# Patient Record
Sex: Female | Born: 2000 | Race: White | Hispanic: No | Marital: Single | Smoking: Never smoker
Health system: Southern US, Community
[De-identification: ages and names within clinical notes are randomized; demographics above are authoritative.]

## PROBLEM LIST (undated history)

## (undated) ENCOUNTER — Inpatient Hospital Stay: Payer: Self-pay

## (undated) DIAGNOSIS — D696 Thrombocytopenia, unspecified: Secondary | ICD-10-CM

## (undated) DIAGNOSIS — Z202 Contact with and (suspected) exposure to infections with a predominantly sexual mode of transmission: Secondary | ICD-10-CM

## (undated) DIAGNOSIS — F32A Depression, unspecified: Secondary | ICD-10-CM

## (undated) DIAGNOSIS — F329 Major depressive disorder, single episode, unspecified: Secondary | ICD-10-CM

## (undated) DIAGNOSIS — F419 Anxiety disorder, unspecified: Secondary | ICD-10-CM

## (undated) DIAGNOSIS — O99119 Other diseases of the blood and blood-forming organs and certain disorders involving the immune mechanism complicating pregnancy, unspecified trimester: Secondary | ICD-10-CM

## (undated) DIAGNOSIS — J45909 Unspecified asthma, uncomplicated: Secondary | ICD-10-CM

## (undated) HISTORY — PX: APPENDECTOMY: SHX54

## (undated) HISTORY — PX: TONSILLECTOMY AND ADENOIDECTOMY: SHX28

---
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
## 2018-09-11 ENCOUNTER — Encounter: Payer: Self-pay | Admitting: Nurse Practitioner

## 2019-08-16 ENCOUNTER — Encounter: Payer: Medicaid Other | Admitting: Obstetrics and Gynecology

## 2019-08-16 NOTE — Progress Notes (Deleted)
Patient, No Pcp Per   No chief complaint on file.   HPI:      Ms. Kimberly Casey is a 19 y.o. G1P0 whose LMP was No LMP recorded., presents today for NP  Diagnosed with chlamydia 08/13/19  Past Medical History:  Diagnosis Date  . Anxiety and depression   . Asthma    pt. reports  childhood asthma is no longer and issue  . STD exposure   . Thrombocytopenia affecting pregnancy [facility])     Past Surgical History:  Procedure Laterality Date  . APPENDECTOMY    . TONSILLECTOMY AND ADENOIDECTOMY      Family History  Family history unknown: Yes    Social History   Socioeconomic History  . Marital status: Single    Spouse name: Not on file  . Number of children: Not on file  . Years of education: Not on file  . Highest education level: Not on file  Occupational History  . Not on file  Tobacco Use  . Smoking status: Never Smoker  . Smokeless tobacco: Never Used  Substance and Sexual Activity  . Alcohol use: No  . Drug use: Yes    Types: Marijuana    Comment: pt. denied drug use, but pt. positive for THC per transfer reports  . Sexual activity: Yes    Comment: pt. is 4 months post partum  Other Topics Concern  . Not on file  Social History Narrative  . Not on file   Social Determinants of Health   Financial Resource Strain:   . Difficulty of Paying Living Expenses:   Food Insecurity:   . Worried About Charity fundraiser in the Last Year:   . Arboriculturist in the Last Year:   Transportation Needs:   . Film/video editor (Medical):   Marland Kitchen Lack of Transportation (Non-Medical):   Physical Activity:   . Days of Exercise per Week:   . Minutes of Exercise per Session:   Stress:   . Feeling of Stress :   Social Connections:   . Frequency of Communication with Friends and Family:   . Frequency of Social Gatherings with Friends and Family:   . Attends Religious Services:   . Active Member of Clubs or Organizations:   . Attends Archivist Meetings:     Marland Kitchen Marital Status:   Intimate Partner Violence:   . Fear of Current or Ex-Partner:   . Emotionally Abused:   Marland Kitchen Physically Abused:   . Sexually Abused:     Outpatient Medications Prior to Visit  Medication Sig Dispense Refill  . divalproex (DEPAKOTE ER) 250 MG 24 hr tablet Take 3 tablets (750 mg total) by mouth at bedtime. 90 tablet 0  . ferrous sulfate 325 (65 FE) MG tablet Take 325 mg by mouth daily with breakfast.    . hydrOXYzine (ATARAX/VISTARIL) 25 MG tablet Take 1 tablet (25 mg total) by mouth 3 (three) times daily as needed for anxiety. 30 tablet 0   No facility-administered medications prior to visit.      ROS:  Review of Systems BREAST: No symptoms   OBJECTIVE:   Vitals:  There were no vitals taken for this visit.  Physical Exam  Results: No results found for this or any previous visit (from the past 24 hour(s)).   Assessment/Plan: No diagnosis found.    No orders of the defined types were placed in this encounter.     No follow-ups on file.  Elmo Putt  B. Derak Schurman, PA-C 08/16/2019 11:55 AM

## 2023-11-03 ENCOUNTER — Encounter: Payer: Self-pay | Admitting: Physician Assistant

## 2023-11-03 ENCOUNTER — Ambulatory Visit: Admitting: Physician Assistant

## 2023-11-03 VITALS — BP 105/70 | HR 96 | Temp 98.1°F | Resp 16 | Ht 69.0 in | Wt 118.0 lb

## 2023-11-03 DIAGNOSIS — Z833 Family history of diabetes mellitus: Secondary | ICD-10-CM | POA: Diagnosis not present

## 2023-11-03 DIAGNOSIS — L659 Nonscarring hair loss, unspecified: Secondary | ICD-10-CM

## 2023-11-03 DIAGNOSIS — Z8639 Personal history of other endocrine, nutritional and metabolic disease: Secondary | ICD-10-CM | POA: Diagnosis not present

## 2023-11-03 DIAGNOSIS — E559 Vitamin D deficiency, unspecified: Secondary | ICD-10-CM | POA: Diagnosis not present

## 2023-11-03 DIAGNOSIS — E782 Mixed hyperlipidemia: Secondary | ICD-10-CM

## 2023-11-03 DIAGNOSIS — R5383 Other fatigue: Secondary | ICD-10-CM

## 2023-11-03 DIAGNOSIS — Z124 Encounter for screening for malignant neoplasm of cervix: Secondary | ICD-10-CM

## 2023-11-03 DIAGNOSIS — E538 Deficiency of other specified B group vitamins: Secondary | ICD-10-CM

## 2023-11-03 NOTE — Progress Notes (Signed)
 [facility] [address] [place], [place] [address]  Internal MEDICINE  Office Visit Note  Patient Name: Kimberly Casey  [id number]  [id number]  Date of Service: 11/03/2023   Complaints/HPI Pt is here for establishment of PCP. Chief Complaint  Patient presents with   New Patient (Initial Visit)   Labs Only    Wants to check thyroid    Hair/Scalp Problem    Bald spot that is worsening   HPI Pt is here to establish care -has not been seen by healthcare provider since she had her daughter 3 years ago. She lives with daughter and daughter's father -not taking any meds and denies any chronic health concerns -interested in labs, specifically thyroid  due to hair loss. Would also like to check vitamins and has a hx of iron deficiency. Also reports Fhx of DM in father and will screen for this -bald spot on back of head Started small but then has been getting bigger. First noticed about 1.5 months ago. States this occurred prior to dying her hair and has not pulled any hair out. Does notice a lot of hair shedding as well. Finds hair everywhere. 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  -no exercising, but is active with her daughter -due for pap and would like referral to see GYN for this  Current Medication: Outpatient Encounter Medications as of 11/03/2023  Medication Sig   [DISCONTINUED] divalproex  (DEPAKOTE  ER) 250 MG 24 hr tablet Take 3 tablets (750 mg total) by mouth at bedtime.   [DISCONTINUED] ferrous sulfate  325 (65 FE) MG tablet Take 325 mg by mouth daily with breakfast.    [DISCONTINUED] hydrOXYzine  (ATARAX /VISTARIL ) 25 MG tablet Take 1 tablet (25 mg total) by mouth 3 (three) times daily as needed for anxiety.   No facility-administered encounter medications on file as of 11/03/2023.    Surgical History: Past Surgical History:  Procedure Laterality Date   APPENDECTOMY     TONSILLECTOMY AND ADENOIDECTOMY      Medical History: Past Medical History:  Diagnosis Date   Anxiety and depression    Asthma    pt. reports  childhood asthma is no longer and issue   STD exposure    Thrombocytopenia affecting pregnancy (HCC)     Family History: Family History  Problem Relation Age of Onset   Crohn's disease Mother    Hearing loss Father    Diabetes Father     Social History   Socioeconomic History   Marital status: Single    Spouse name: Not on file   Number of children: Not on file   Years of education: Not on file   Highest education level: Not on file  Occupational History   Not on file  Tobacco Use   Smoking status: Never   Smokeless tobacco: Never  Substance and Sexual Activity   Alcohol use: No   Drug use: Yes    Types: Marijuana    Comment: pt. denied drug use, but pt. positive for THC per transfer reports   Sexual activity: Yes    Comment: pt. is 4 months post partum  Other Topics Concern  Not on file  Social History Narrative   Not on file   Social Drivers of Health   Financial Resource Strain: Not on file  Food Insecurity: Not on file  Transportation Needs: Not on file  Physical Activity: Not on file  Stress: Not on file  Social Connections: Not on file  Intimate Partner Violence: Not on file     Review of Systems  Constitutional:  Negative for chills, fatigue and unexpected weight change.  HENT:  Negative for congestion, rhinorrhea, sneezing and sore throat.   Eyes:  Negative for redness.  Respiratory:  Negative for cough, chest tightness and shortness of breath.   Cardiovascular:  Negative for chest pain and  palpitations.  Gastrointestinal:  Negative for abdominal pain, constipation, diarrhea, nausea and vomiting.  Genitourinary:  Negative for dysuria and frequency.  Musculoskeletal:  Negative for arthralgias, back pain, joint swelling and neck pain.  Skin:  Negative for rash.       Bald spot  Neurological: Negative.  Negative for tremors and numbness.  Hematological:  Negative for adenopathy. Does not bruise/bleed easily.  Psychiatric/Behavioral:  Negative for behavioral problems (Depression), sleep disturbance and suicidal ideas. The patient is not nervous/anxious.     Vital Signs: BP 105/70   Pulse 96   Temp 98.1 F (36.7 C)   Resp 16   Ht 5' 9 (1.753 m)   Wt 118 lb (53.5 kg)   SpO2 100%   BMI 17.43 kg/m    Physical Exam Vitals and nursing note reviewed.  Constitutional:      General: She is not in acute distress.    Appearance: She is well-developed. She is not diaphoretic.  HENT:     Head: Normocephalic and atraumatic.     Comments: Bald spot on back left of scalp Eyes:     Extraocular Movements: Extraocular movements intact.  Neck:     Thyroid : No thyromegaly.     Vascular: No JVD.     Trachea: No tracheal deviation.  Cardiovascular:     Rate and Rhythm: Normal rate and regular rhythm.     Heart sounds: Normal heart sounds. No murmur heard.    No friction rub. No gallop.  Pulmonary:     Effort: Pulmonary effort is normal. No respiratory distress.     Breath sounds: No wheezing or rales.  Chest:     Chest wall: No tenderness.  Skin:    General: Skin is warm and dry.  Neurological:     Mental Status: She is alert and oriented to person, place, and time.  Psychiatric:        Behavior: Behavior normal.        Thought Content: Thought content normal.        Judgment: Judgment normal.       Assessment/Plan: 1. Alopecia (Primary) Will check labs and will also refer to dermatology given pattern of hair loss - TSH + free T4 - ANA w/Reflex if Positive -  Ambulatory referral to Dermatology  2. Hx of iron deficiency - Fe+TIBC+Fer  3. Vitamin D deficiency - VITAMIN D 25 Hydroxy (Vit-D Deficiency, Fractures)  4. FHx: diabetes mellitus - Hgb A1C w/o eAG  5. B12 deficiency - B12 and Folate Panel  6. Mixed hyperlipidemia - Lipid Panel With LDL/HDL Ratio  7. Encounter for Papanicolaou smear of cervix - Ambulatory referral to Obstetrics / Gynecology  8. Other fatigue - CBC w/Diff/Platelet - Comprehensive metabolic panel with GFR - TSH + free T4 - Lipid Panel With  LDL/HDL Ratio - ANA w/Reflex if Positive - B12 and Folate Panel - VITAMIN D 25 Hydroxy (Vit-D Deficiency, Fractures) - Fe+TIBC+Fer - Hgb A1C w/o eAG   General Counseling: Alika verbalizes understanding of the findings of todays visit and agrees with plan of treatment. I have discussed any further diagnostic evaluation that may be needed or ordered today. We also reviewed her medications today. she has been encouraged to call the office with any questions or concerns that should arise related to todays visit.    Counseling:    Orders Placed This Encounter  Procedures   CBC w/Diff/Platelet   Comprehensive metabolic panel with GFR   TSH + free T4   Lipid Panel With LDL/HDL Ratio   ANA w/Reflex if Positive   B12 and Folate Panel   VITAMIN D 25 Hydroxy (Vit-D Deficiency, Fractures)   Fe+TIBC+Fer   Hgb A1C w/o eAG   Ambulatory referral to Dermatology   Ambulatory referral to Obstetrics / Gynecology    No orders of the defined types were placed in this encounter.    This patient was seen by Tinnie Pro, PA-C in collaboration with Dr. Sigrid Bathe as a part of collaborative care agreement.   Time spent:35 Minutes

## 2023-11-04 ENCOUNTER — Telehealth: Payer: Self-pay | Admitting: Physician Assistant

## 2023-11-04 NOTE — Telephone Encounter (Signed)
 Dermatology referral sent via [facility] to [facility].  Lvm notifying patient. Gave telephone# [phone]

## 2023-11-09 LAB — COMPREHENSIVE METABOLIC PANEL WITH GFR
ALT: 10 IU/L (ref 0–32)
AST: 13 IU/L (ref 0–40)
Albumin: 4.6 g/dL (ref 4.0–5.0)
Alkaline Phosphatase: 44 IU/L (ref 44–121)
BUN/Creatinine Ratio: 12 (ref 9–23)
BUN: 9 mg/dL (ref 6–20)
Bilirubin Total: 0.7 mg/dL (ref 0.0–1.2)
CO2: 22 mmol/L (ref 20–29)
Calcium: 9.5 mg/dL (ref 8.7–10.2)
Chloride: 104 mmol/L (ref 96–106)
Creatinine, Ser: 0.76 mg/dL (ref 0.57–1.00)
Globulin, Total: 2.1 g/dL (ref 1.5–4.5)
Glucose: 79 mg/dL (ref 70–99)
Potassium: 4.2 mmol/L (ref 3.5–5.2)
Sodium: 141 mmol/L (ref 134–144)
Total Protein: 6.7 g/dL (ref 6.0–8.5)
eGFR: 111 mL/min/1.73 (ref >59)

## 2023-11-09 LAB — CBC WITH DIFFERENTIAL/PLATELET
Basophils Absolute: 0 x10E3/uL (ref 0.0–0.2)
Basos: 1 %
EOS (ABSOLUTE): 0.1 x10E3/uL (ref 0.0–0.4)
Eos: 2 %
Hematocrit: 38.9 % (ref 34.0–46.6)
Hemoglobin: 13.1 g/dL (ref 11.1–15.9)
Immature Grans (Abs): 0 x10E3/uL (ref 0.0–0.1)
Immature Granulocytes: 0 %
Lymphocytes Absolute: 1.5 x10E3/uL (ref 0.7–3.1)
Lymphs: 43 %
MCH: 30.5 pg (ref 26.6–33.0)
MCHC: 33.7 g/dL (ref 31.5–35.7)
MCV: 91 fL (ref 79–97)
Monocytes Absolute: 0.2 x10E3/uL (ref 0.1–0.9)
Monocytes: 7 %
Neutrophils Absolute: 1.6 x10E3/uL (ref 1.4–7.0)
Neutrophils: 47 %
Platelets: 142 x10E3/uL — ABNORMAL LOW (ref 150–450)
RBC: 4.29 x10E6/uL (ref 3.77–5.28)
RDW: 11.9 % (ref 11.7–15.4)
WBC: 3.5 x10E3/uL (ref 3.4–10.8)

## 2023-11-09 LAB — LIPID PANEL WITH LDL/HDL RATIO
Cholesterol, Total: 108 mg/dL (ref 100–199)
HDL: 54 mg/dL (ref >39)
LDL Chol Calc (NIH): 41 mg/dL (ref 0–99)
LDL/HDL Ratio: 0.8 ratio (ref 0.0–3.2)
Triglycerides: 60 mg/dL (ref 0–149)
VLDL Cholesterol Cal: 13 mg/dL (ref 5–40)

## 2023-11-09 LAB — B12 AND FOLATE PANEL
Folate: 13.5 ng/mL (ref >3.0)
Vitamin B-12: 597 pg/mL (ref 232–1245)

## 2023-11-09 LAB — HGB A1C W/O EAG: Hgb A1c MFr Bld: 5.1 % (ref 4.8–5.6)

## 2023-11-09 LAB — TSH+FREE T4
Free T4: 1.42 ng/dL (ref 0.82–1.77)
TSH: 1.08 u[IU]/mL (ref 0.450–4.500)

## 2023-11-09 LAB — VITAMIN D 25 HYDROXY (VIT D DEFICIENCY, FRACTURES): Vit D, 25-Hydroxy: 37.4 ng/mL (ref 30.0–100.0)

## 2023-11-09 LAB — ANA W/REFLEX IF POSITIVE: Anti Nuclear Antibody (ANA): NEGATIVE

## 2023-11-15 ENCOUNTER — Ambulatory Visit: Payer: Self-pay | Admitting: Physician Assistant

## 2023-11-16 LAB — SPECIMEN STATUS REPORT

## 2023-11-16 LAB — FERRITIN: Ferritin: 39 ng/mL (ref 15–150)

## 2023-11-16 LAB — IRON AND TIBC
Iron Saturation: 42 % (ref 15–55)
Iron: 145 ug/dL (ref 27–159)
Total Iron Binding Capacity: 344 ug/dL (ref 250–450)
UIBC: 199 ug/dL (ref 131–425)

## 2023-12-22 ENCOUNTER — Encounter: Payer: Self-pay | Admitting: Physician Assistant

## 2023-12-22 ENCOUNTER — Ambulatory Visit: Admitting: Physician Assistant

## 2023-12-22 VITALS — BP 104/70 | HR 101 | Temp 98.3°F | Resp 16 | Ht 69.0 in | Wt 119.0 lb

## 2023-12-22 DIAGNOSIS — D696 Thrombocytopenia, unspecified: Secondary | ICD-10-CM | POA: Diagnosis not present

## 2023-12-22 DIAGNOSIS — L659 Nonscarring hair loss, unspecified: Secondary | ICD-10-CM

## 2023-12-22 DIAGNOSIS — R300 Dysuria: Secondary | ICD-10-CM | POA: Diagnosis not present

## 2023-12-22 DIAGNOSIS — Z0001 Encounter for general adult medical examination with abnormal findings: Secondary | ICD-10-CM

## 2023-12-22 NOTE — Progress Notes (Signed)
 [facility] [address] [place], [place] [address]  Internal MEDICINE  Office Visit Note  Patient Name: Kimberly Casey  [id number]  [id number]  Date of Service: 12/22/2023  Chief Complaint  Patient presents with   Annual Exam    Review labs     HPI Pt is here for routine health maintenance examination -doing well, no new concerns today -labs reviewed and normal except for low platelet. Upon chart review has had this before and may be chronic though most previous labs during pregnancy. Will recheck. Does admit easy bruising for a long time -GYN appt tomorrow for pap -still has circular patch of hair loss. A little growth now though. Hasn't heard from dermatology and will follow up on this -sleeping well.  -Appetite good.  -taking multivitamin  Current Medication: No outpatient encounter medications on file as of 12/22/2023.   No facility-administered encounter medications on file as of 12/22/2023.    Surgical History: Past Surgical History:  Procedure Laterality Date   APPENDECTOMY     TONSILLECTOMY AND ADENOIDECTOMY      Medical History: Past Medical History:  Diagnosis Date   Anxiety and depression    Asthma    pt. reports  childhood asthma is no longer and issue   STD exposure    Thrombocytopenia affecting pregnancy     Family History: Family History  Problem Relation Age of Onset   Crohn's disease Mother    Hearing loss Father    Diabetes Father       Review of Systems  Constitutional:  Negative for chills, fatigue and unexpected weight change.  HENT:  Negative for congestion, rhinorrhea, sneezing and sore throat.   Eyes:  Negative for redness.  Respiratory:  Negative for cough, chest tightness and shortness of breath.   Cardiovascular:  Negative for chest pain and palpitations.  Gastrointestinal:  Negative for abdominal pain, constipation, diarrhea, nausea and vomiting.  Genitourinary:  Negative for dysuria and frequency.   Musculoskeletal:  Negative for arthralgias, back pain, joint swelling and neck pain.  Skin:  Negative for rash.       Bald spot  Neurological: Negative.  Negative for tremors and numbness.  Hematological:  Negative for adenopathy. Does not bruise/bleed easily.  Psychiatric/Behavioral:  Negative for behavioral problems (Depression), sleep disturbance and suicidal ideas. The patient is not nervous/anxious.      Vital Signs: BP 104/70   Pulse (!) 101   Temp 98.3 F (36.8 C)   Resp 16   Ht 5' 9 (1.753 m)   Wt 119 lb (54 kg)   SpO2 98%   BMI 17.57 kg/m    Physical Exam Vitals and nursing note reviewed.  Constitutional:      General: She is not in acute distress.    Appearance: She is well-developed. She is not diaphoretic.  HENT:     Head: Normocephalic and atraumatic.     Comments: Bald spot on back left of scalp, some small new growth on exam Eyes:     Extraocular Movements: Extraocular movements intact.  Neck:     Thyroid : No thyromegaly.     Vascular: No JVD.     Trachea: No tracheal deviation.  Cardiovascular:     Rate and Rhythm: Normal rate and regular rhythm.     Heart sounds: Normal heart sounds. No murmur heard.    No friction rub. No gallop.  Pulmonary:     Effort: Pulmonary effort is normal. No respiratory distress.     Breath sounds:  No wheezing or rales.  Chest:     Chest wall: No tenderness.  Skin:    General: Skin is warm and dry.  Neurological:     Mental Status: She is alert and oriented to person, place, and time.  Psychiatric:        Behavior: Behavior normal.        Thought Content: Thought content normal.        Judgment: Judgment normal.      LABS: Recent Results (from the past 2160 hours)  CBC w/Diff/Platelet     Status: Abnormal   Collection Time: 11/08/23 11:09 AM  Result Value Ref Range   WBC 3.5 3.4 - 10.8 x10E3/uL   RBC 4.29 3.77 - 5.28 x10E6/uL   Hemoglobin 13.1 11.1 - 15.9 g/dL   Hematocrit 61.0 65.9 - 46.6 %   MCV 91 79 -  97 fL   MCH 30.5 26.6 - 33.0 pg   MCHC 33.7 31.5 - 35.7 g/dL   RDW 88.0 88.2 - 84.5 %   Platelets 142 (L) 150 - 450 x10E3/uL   Neutrophils 47 Not Estab. %   Lymphs 43 Not Estab. %   Monocytes 7 Not Estab. %   Eos 2 Not Estab. %   Basos 1 Not Estab. %   Neutrophils Absolute 1.6 1.4 - 7.0 x10E3/uL   Lymphocytes Absolute 1.5 0.7 - 3.1 x10E3/uL   Monocytes Absolute 0.2 0.1 - 0.9 x10E3/uL   EOS (ABSOLUTE) 0.1 0.0 - 0.4 x10E3/uL   Basophils Absolute 0.0 0.0 - 0.2 x10E3/uL   Immature Granulocytes 0 Not Estab. %   Immature Grans (Abs) 0.0 0.0 - 0.1 x10E3/uL  Comprehensive metabolic panel with GFR     Status: None   Collection Time: 11/08/23 11:09 AM  Result Value Ref Range   Glucose 79 70 - 99 mg/dL   BUN 9 6 - 20 mg/dL   Creatinine, Ser 9.23 0.57 - 1.00 mg/dL   eGFR 888 >40 fO/fpw/8.26   BUN/Creatinine Ratio 12 9 - 23   Sodium 141 134 - 144 mmol/L   Potassium 4.2 3.5 - 5.2 mmol/L   Chloride 104 96 - 106 mmol/L   CO2 22 20 - 29 mmol/L   Calcium 9.5 8.7 - 10.2 mg/dL   Total Protein 6.7 6.0 - 8.5 g/dL   Albumin 4.6 4.0 - 5.0 g/dL   Globulin, Total 2.1 1.5 - 4.5 g/dL   Bilirubin Total 0.7 0.0 - 1.2 mg/dL   Alkaline Phosphatase 44 44 - 121 IU/L    Comment: **Effective November 14, 2023 Alkaline Phosphatase**   reference interval will be changing to:              Age                Female          Female           0 -  5 days         47 - 127       47 - 127           6 - 10 days         29 - 242       29 - 242          11 - 20 days        109 - 357      109 - 357          21 - 30  days         94 - 494       94 - 494           1 -  2 months      149 - 539      149 - 539           3 -  6 months      131 - 452      131 - 452           7 - 11 months      117 - 401      117 - 401   12 months -  6 years       158 - 369      158 - 369           7 - 12 years       150 - 409      150 - 409               13 years       156 - 435       78 - 227               14 years       114 - 375       64 -  161               15 years        88 - 279       56 - 134               16 years        74 - 207       51 - 121               17 years        63 - 161       47 - 113          18 - 20 years        51 - 125       42 - 106          21 - 50 years         47 - 123       41 - 116          51 - 80 years        49 - 135       51 - 125              >80 years        48 - 129       48 - 129    AST 13 0 - 40 IU/L   ALT 10 0 - 32 IU/L  TSH + free T4     Status: None   Collection Time: 11/08/23 11:09 AM  Result Value Ref Range   TSH 1.080 0.450 - 4.500 uIU/mL   Free T4 1.42 0.82 - 1.77 ng/dL  Lipid Panel With LDL/HDL Ratio     Status: None   Collection Time: 11/08/23 11:09 AM  Result Value Ref Range   Cholesterol, Total 108 100 - 199 mg/dL   Triglycerides 60 0 - 149 mg/dL   HDL 54 >60 mg/dL   VLDL Cholesterol Cal 13 5 - 40 mg/dL   LDL Chol Calc (NIH) 41 0 - 99 mg/dL  LDL/HDL Ratio 0.8 0.0 - 3.2 ratio    Comment:                                     LDL/HDL Ratio                                             Men  Women                               1/2 Avg.Risk  1.0    1.5                                   Avg.Risk  3.6    3.2                                2X Avg.Risk  6.2    5.0                                3X Avg.Risk  8.0    6.1   ANA w/Reflex if Positive     Status: None   Collection Time: 11/08/23 11:09 AM  Result Value Ref Range   Anti Nuclear Antibody (ANA) Negative Negative  B12 and Folate Panel     Status: None   Collection Time: 11/08/23 11:09 AM  Result Value Ref Range   Vitamin B-12 597 232 - 1,245 pg/mL   Folate 13.5 >3.0 ng/mL    Comment: A serum folate concentration of less than 3.1 ng/mL is considered to represent clinical deficiency.   VITAMIN D  25 Hydroxy (Vit-D Deficiency, Fractures)     Status: None   Collection Time: 11/08/23 11:09 AM  Result Value Ref Range   Vit D, 25-Hydroxy 37.4 30.0 - 100.0 ng/mL    Comment: Vitamin D  deficiency has been defined by the  Institute of Medicine and an Endocrine Society practice guideline as a level of serum 25-OH vitamin D  less than 20 ng/mL (1,2). The Endocrine Society went on to further define vitamin D  insufficiency as a level between 21 and 29 ng/mL (2). 1. IOM (Institute of Medicine). 2010. Dietary reference    intakes for calcium and D. [place]: The    [employer]. 2. Holick MF, Binkley Heron, Bischoff-Ferrari HA, et al.    Evaluation, treatment, and prevention of vitamin D     deficiency: an Endocrine Society clinical practice    guideline. JCEM. 2011 Jul; 96(7):1911-30.   Hgb A1C w/o eAG     Status: None   Collection Time: 11/08/23 11:09 AM  Result Value Ref Range   Hgb A1c MFr Bld 5.1 4.8 - 5.6 %    Comment:          Prediabetes: 5.7 - 6.4          Diabetes: >6.4          Glycemic control for adults with diabetes: <7.0   Iron and TIBC     Status: None   Collection Time: 11/08/23 11:09 AM  Result Value Ref Range   Total  Iron Binding Capacity 344 250 - 450 ug/dL   UIBC 800 868 - 574 ug/dL   Iron 854 27 - 840 ug/dL   Iron Saturation 42 15 - 55 %  Ferritin     Status: None   Collection Time: 11/08/23 11:09 AM  Result Value Ref Range   Ferritin 39 15 - 150 ng/mL  Specimen status report     Status: None   Collection Time: 11/08/23 11:09 AM  Result Value Ref Range   specimen status report Comment     Comment: Written Authorization Written Authorization Written Authorization Received. Authorization received from NIMISHA PATEL 11-16-2023 Logged by Darice Brake   UA/M w/rflx Culture, Routine     Status: Abnormal (Preliminary result)   Collection Time: 12/22/23  3:58 PM   Specimen: Urine   Urine  Result Value Ref Range   Specific Gravity, UA 1.020 1.005 - 1.030   pH, UA 7.0 5.0 - 7.5   Color, UA Yellow Yellow   Appearance Ur Clear Clear   Leukocytes,UA Trace (A) Negative   Protein,UA Negative Negative/Trace   Glucose, UA Negative Negative   Ketones, UA Negative  Negative   RBC, UA Negative Negative   Bilirubin, UA Negative Negative   Urobilinogen, Ur 1.0 0.2 - 1.0 mg/dL   Nitrite, UA Negative Negative   Microscopic Examination See below:     Comment: Microscopic was indicated and was performed.   Urinalysis Reflex WILL FOLLOW   Microscopic Examination     Status: None (Preliminary result)   Collection Time: 12/22/23  3:58 PM   Urine  Result Value Ref Range   WBC, UA WILL FOLLOW    RBC, Urine WILL FOLLOW    Epithelial Cells (non renal) WILL FOLLOW    Renal Epithel, UA WILL FOLLOW    Casts WILL FOLLOW    Cast Type WILL FOLLOW    Crystals WILL FOLLOW    Crystal Type WILL FOLLOW    Mucus, UA WILL FOLLOW    Bacteria, UA WILL FOLLOW    Yeast, UA WILL FOLLOW    Trichomonas, UA WILL FOLLOW    Urinalysis Comments WILL FOLLOW         Assessment/Plan: 1. Encounter for general adult medical examination with abnormal findings (Primary) CPE performed, labs reviewed, pap scheduled for tomorrow  2. Alopecia Labs normal, some new growth on exam today, but will still follow up on dermatology referral  3. Low platelet count Will monitor, may need hematology in future - CBC w/Diff/Platelet  4. Dysuria - UA/M w/rflx Culture, Routine   General Counseling: Kimberly Casey verbalizes understanding of the findings of todays visit and agrees with plan of treatment. I have discussed any further diagnostic evaluation that may be needed or ordered today. We also reviewed her medications today. she has been encouraged to call the office with any questions or concerns that should arise related to todays visit.    Counseling:    Orders Placed This Encounter  Procedures   Microscopic Examination   UA/M w/rflx Culture, Routine   CBC w/Diff/Platelet    No orders of the defined types were placed in this encounter.   This patient was seen by Tinnie Pro, PA-C in collaboration with Dr. Sigrid Bathe as a part of collaborative care agreement.  Total time  spent:30 Minutes  Time spent includes review of chart, medications, test results, and follow up plan with the patient.     Sigrid CHRISTELLA Bathe, MD  Internal Medicine

## 2023-12-23 ENCOUNTER — Encounter: Payer: Self-pay | Admitting: Licensed Practical Nurse

## 2023-12-26 ENCOUNTER — Ambulatory Visit: Payer: Self-pay | Admitting: Physician Assistant

## 2023-12-26 ENCOUNTER — Other Ambulatory Visit: Payer: Self-pay

## 2023-12-26 LAB — URINE CULTURE, REFLEX

## 2023-12-26 LAB — MICROSCOPIC EXAMINATION: Casts: NONE SEEN /LPF

## 2023-12-26 LAB — UA/M W/RFLX CULTURE, ROUTINE
Bilirubin, UA: NEGATIVE
Glucose, UA: NEGATIVE
Ketones, UA: NEGATIVE
Nitrite, UA: NEGATIVE
Protein,UA: NEGATIVE
RBC, UA: NEGATIVE
Specific Gravity, UA: 1.02 (ref 1.005–1.030)
Urobilinogen, Ur: 1 mg/dL (ref 0.2–1.0)
pH, UA: 7 (ref 5.0–7.5)

## 2023-12-26 MED ORDER — NITROFURANTOIN MONOHYD MACRO 100 MG PO CAPS: 100.0000 mg | ORAL_CAPSULE | Freq: Two times a day (BID) | ORAL | 0 refills | Status: AC

## 2023-12-26 NOTE — Telephone Encounter (Signed)
-----   Message from Tinnie MARLA Pro sent at 12/26/2023 11:55 AM EDT ----- Please let her know that her urine came back with UTI. Please send macrobid BID x7 days. ----- Message ----- From: Interface, [facility] Lab Results In Sent: 12/23/2023   1:35 PM EDT To: Tinnie MARLA Pro, PA-C

## 2023-12-26 NOTE — Telephone Encounter (Signed)
 Spoke with patient regarding UTI and sent Macrobid BID x7 days per Tinnie.

## 2023-12-27 ENCOUNTER — Emergency Department
Admission: EM | Admit: 2023-12-27 | Discharge: 2023-12-27 | Disposition: A | Discharge status: Home / Self Care | Attending: Emergency Medicine | Admitting: Emergency Medicine

## 2023-12-27 ENCOUNTER — Other Ambulatory Visit: Payer: Self-pay

## 2023-12-27 ENCOUNTER — Encounter: Payer: Self-pay | Admitting: Emergency Medicine

## 2023-12-27 DIAGNOSIS — R55 Syncope and collapse: Secondary | ICD-10-CM | POA: Insufficient documentation

## 2023-12-27 LAB — COMPREHENSIVE METABOLIC PANEL WITH GFR
ALT: 10 U/L (ref 0–44)
AST: 13 U/L — ABNORMAL LOW (ref 15–41)
Albumin: 4.1 g/dL (ref 3.5–5.0)
Alkaline Phosphatase: 40 U/L (ref 38–126)
Anion gap: 7 (ref 5–15)
BUN: 10 mg/dL (ref 6–20)
CO2: 25 mmol/L (ref 22–32)
Calcium: 8.9 mg/dL (ref 8.9–10.3)
Chloride: 108 mmol/L (ref 98–111)
Creatinine, Ser: 0.61 mg/dL (ref 0.44–1.00)
GFR, Estimated: >60 mL/min (ref >60)
Glucose, Bld: 127 mg/dL — ABNORMAL HIGH (ref 70–99)
Potassium: 3.8 mmol/L (ref 3.5–5.1)
Sodium: 140 mmol/L (ref 135–145)
Total Bilirubin: 0.4 mg/dL (ref 0.0–1.2)
Total Protein: 6.7 g/dL (ref 6.5–8.1)

## 2023-12-27 LAB — URINALYSIS, ROUTINE W REFLEX MICROSCOPIC
Bacteria, UA: NONE SEEN
Bilirubin Urine: NEGATIVE
Glucose, UA: NEGATIVE mg/dL
Ketones, ur: NEGATIVE mg/dL
Leukocytes,Ua: NEGATIVE
Nitrite: NEGATIVE
Protein, ur: NEGATIVE mg/dL
RBC / HPF: >50 RBC/hpf (ref 0–5)
Specific Gravity, Urine: 1.019 (ref 1.005–1.030)
pH: 5 (ref 5.0–8.0)

## 2023-12-27 LAB — PREGNANCY, URINE: Preg Test, Ur: NEGATIVE

## 2023-12-27 LAB — CBC
HCT: 36.4 % (ref 36.0–46.0)
Hemoglobin: 12.3 g/dL (ref 12.0–15.0)
MCH: 29.9 pg (ref 26.0–34.0)
MCHC: 33.8 g/dL (ref 30.0–36.0)
MCV: 88.6 fL (ref 80.0–100.0)
Platelets: 138 K/uL — ABNORMAL LOW (ref 150–400)
RBC: 4.11 MIL/uL (ref 3.87–5.11)
RDW: 11.6 % (ref 11.5–15.5)
WBC: 6.5 K/uL (ref 4.0–10.5)
nRBC: 0 % (ref 0.0–0.2)

## 2023-12-27 LAB — POC URINE PREG, ED: Preg Test, Ur: NEGATIVE

## 2023-12-27 NOTE — ED Triage Notes (Signed)
 Pt to ER states she was shopping at [facility] when she became hot and sweaty and felt like she was going to pass out.  Pt denies LOC.  States she started an abx for UTI last night.  Pt states this happened approx 30 mins PTA. Pt states all symptoms have resolved at this time.

## 2023-12-27 NOTE — ED Notes (Addendum)
 Patient states she has an existing UTI and started Macrobid last night. Patient states she is on her period.

## 2023-12-27 NOTE — ED Notes (Signed)
Patient states she feels fine now.

## 2023-12-27 NOTE — ED Provider Notes (Signed)
 [facility] Provider Note    Event Date/Time   First MD Initiated Contact with Patient 12/27/23 1739     (approximate)   History   Near Syncope   HPI  Kimberly Casey is a 23 year old female presenting to the ER for evaluation following a near simple episode.  Patient was walking around at [facility] lobby when she began to feel warm and lightheaded.  Thought she may pass out, but did not actually have a syncopal episode.  No chest pain or shortness of breath.  Reports that she has had some upper respiratory symptoms as well as a UTI for which she was recently started on antibiotics.  Is on her menstrual cycle, reports that it is not uncommon for her to have cramping associated with this but does not remember having significant pain prior to her episode.  Ate breakfast this morning, was planning to have another meal after she finished shopping, otherwise reports recent normal oral intake.  Currently reports complete resolution of her symptoms.    Physical Exam   Triage Vital Signs: ED Triage Vitals  Encounter Vitals Group     BP 12/27/23 1710 113/70     Girls Systolic BP Percentile --      Girls Diastolic BP Percentile --      Boys Systolic BP Percentile --      Boys Diastolic BP Percentile --      Pulse Rate 12/27/23 1710 86     Resp 12/27/23 1710 18     Temp 12/27/23 1710 98.8 F (37.1 C)     Temp Source 12/27/23 1710 Oral     SpO2 12/27/23 1710 100 %     Weight 12/27/23 1711 120 lb (54.4 kg)     Height 12/27/23 1711 5' 8 (1.727 m)     Head Circumference --      Peak Flow --      Pain Score 12/27/23 1711 0     Pain Loc --      Pain Education --      Exclude from Growth Chart --     Most recent vital signs: Vitals:   12/27/23 1710  BP: 113/70  Pulse: 86  Resp: 18  Temp: 98.8 F (37.1 C)  SpO2: 100%     General: Awake, interactive  CV:  Good peripheral perfusion Resp:  Unlabored respirations, lungs clear to auscultation Abd:  Nondistended,  soft, nontender Neuro:  Symmetric facial movement, fluid speech   ED Results / Procedures / Treatments   Labs (all labs ordered are listed, but only abnormal results are displayed) Labs Reviewed  COMPREHENSIVE METABOLIC PANEL WITH GFR - Abnormal; Notable for the following components:      Result Value   Glucose, Bld 127 (*)    AST 13 (*)    All other components within normal limits  CBC - Abnormal; Notable for the following components:   Platelets 138 (*)    All other components within normal limits  URINALYSIS, ROUTINE W REFLEX MICROSCOPIC - Abnormal; Notable for the following components:   Color, Urine YELLOW (*)    APPearance HAZY (*)    Hgb urine dipstick MODERATE (*)    All other components within normal limits  PREGNANCY, URINE  POC URINE PREG, ED  CBG MONITORING, ED     EKG EKG independently reviewed and interpreted by myself demonstrates:  EKG demonstrates normal sinus rhythm at a rate of 78, PR 164, QRS 96, QTc 440, no acute ST changes  RADIOLOGY Imaging independently reviewed and interpreted by myself demonstrates:   Formal Radiology Read:  No results found.  PROCEDURES:  Critical Care performed: No  Procedures   MEDICATIONS ORDERED IN ED: Medications - No data to display   IMPRESSION / MDM / ASSESSMENT AND PLAN / ED COURSE  I reviewed the triage vital signs and the nursing notes.  Differential diagnosis includes, but is not limited to arrhythymia, vasovagal syncope, dehydration, anemia, electrolyte abnormality  Patient's presentation is most consistent with acute presentation with potential threat to life or bodily function.  Presents following a near syncopal episode.  Labs with reassuring CBC, CMP.  Urine with presence of blood likely contaminant from her menstrual cycle, otherwise not suggestive of infection.  EKG without acute ischemic findings.  UPT negative.  Patient reassessed.  Remains without recurrent lightheadedness symptoms.  Able to  ambulate with steady gait.  Unclear exact precipitant of her episode, possibly related to recent illness, but overall low suspicion emergent process.  Patient is comfortable with discharge home.  Strict return precautions provided.  Patient discharged in stable condition.       FINAL CLINICAL IMPRESSION(S) / ED DIAGNOSES   Final diagnoses:  Near syncope     Rx / DC Orders   ED Discharge Orders     None        Note:  This document was prepared using Dragon voice recognition software and may include unintentional dictation errors.   Levander Slate, MD 12/27/23 1900

## 2023-12-27 NOTE — Discharge Instructions (Signed)
 You were seen in the emergency department feeling like you might pass out.  Your testing was fortunately overall reassuring.  Please arrange follow-up with a primary care provider in the next few days for reevaluation. Return to the ER immediately if you develop chest pain, shortness of breath, it feels like your heart is racing, repeated episodes, or other new or concerning symptoms.

## 2024-12-24 ENCOUNTER — Encounter: Admitting: Physician Assistant
